# Patient Record
Sex: Male | Born: 1975 | Race: Black or African American | Hispanic: No | Marital: Married | Smoking: Never smoker
Health system: Southern US, Community
[De-identification: ages and names within clinical notes are randomized; demographics above are authoritative.]

## PROBLEM LIST (undated history)

## (undated) HISTORY — PX: URETHRAL DILATION: SUR417

## (undated) HISTORY — PX: VASECTOMY: SHX75

---

## 2007-04-07 ENCOUNTER — Ambulatory Visit: Payer: Self-pay | Admitting: Internal Medicine

## 2007-04-07 DIAGNOSIS — B351 Tinea unguium: Secondary | ICD-10-CM

## 2008-04-30 ENCOUNTER — Emergency Department (HOSPITAL_COMMUNITY): Admission: EM | Admit: 2008-04-30 | Discharge: 2008-04-30 | Payer: Self-pay | Admitting: Emergency Medicine

## 2009-10-12 IMAGING — CT CT ABDOMEN W/O CM
2 of 4 series · 17 of 46 positions shown, 19 images · non-contrast
Comparison: None available

CT ABDOMEN

CLINICAL DATA: Right flank pain

CT OF THE ABDOMEN AND PELVIS WITHOUT CONTRAST (CT UROGRAM)
TECHNIQUE: Multidetector CT imaging was performed through the
abdomen and pelvis to include the urinary tract.

[Series 2: stone_wo 5.0 b40f st · axial · 0.62mm/px · z∈[+224,+568]mm · 14 of 94 slices shown, 16 images]
[im 4/94  soft-tissue]
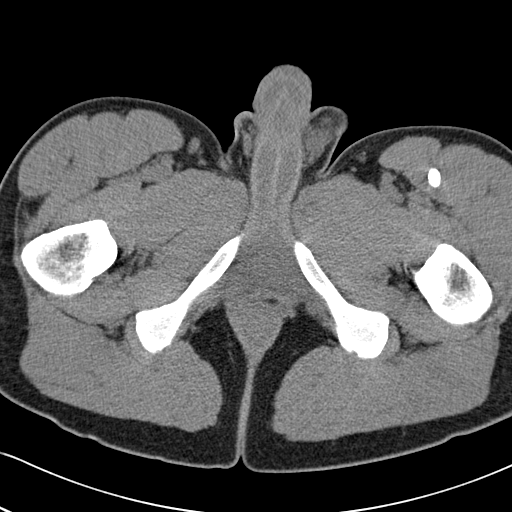
[im 4/94  bone]
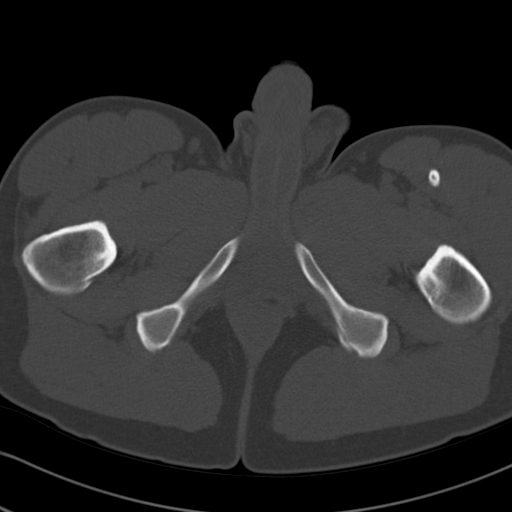
[im 11/94  soft-tissue]
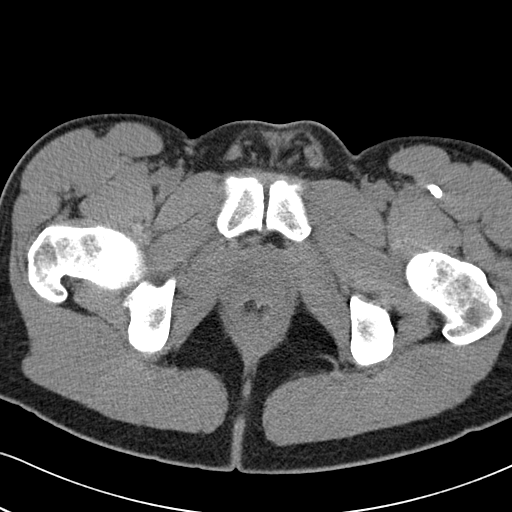
[im 18/94  soft-tissue]
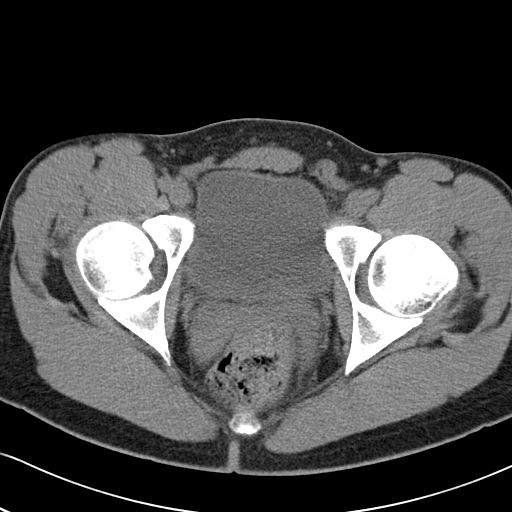
[im 26/94  soft-tissue]
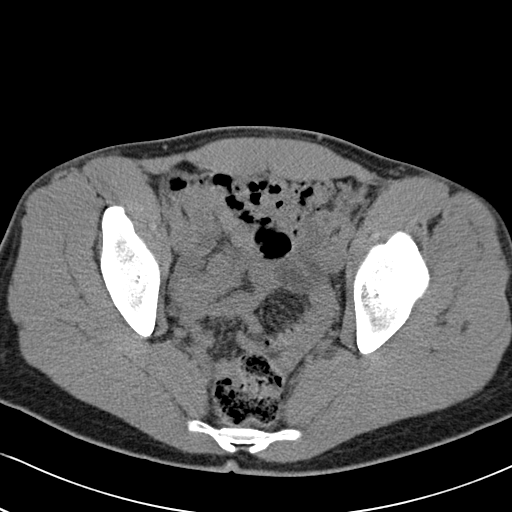
[im 33/94  soft-tissue]
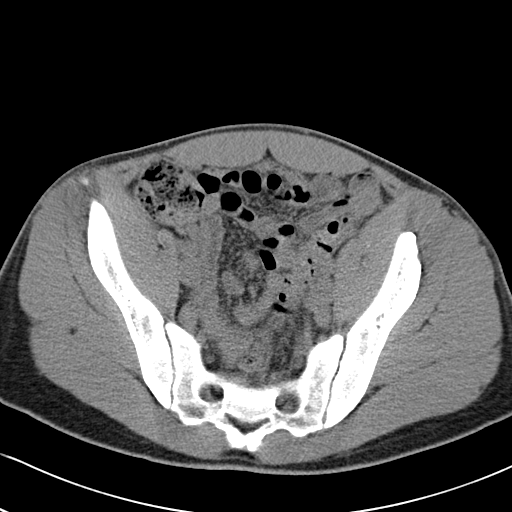
[im 36/94  soft-tissue]
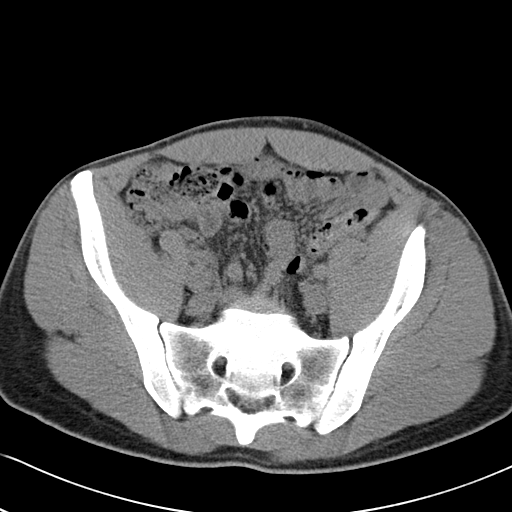
[im 43/94  soft-tissue]
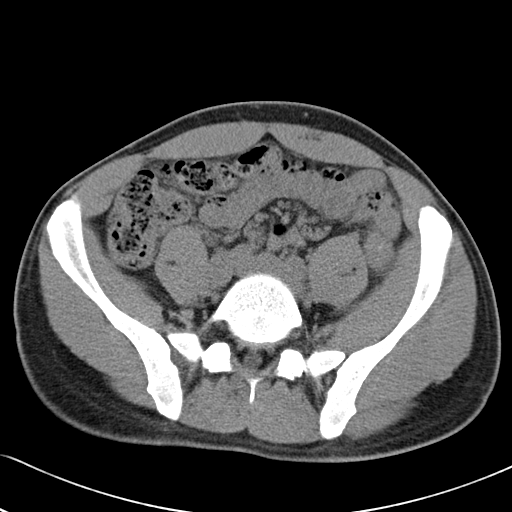
[im 51/94  soft-tissue]
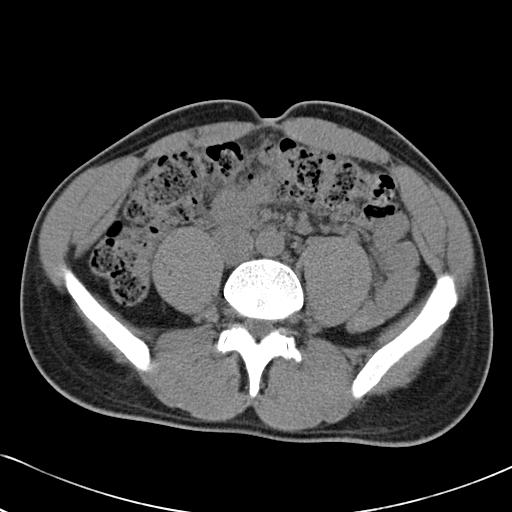
[im 58/94  soft-tissue]
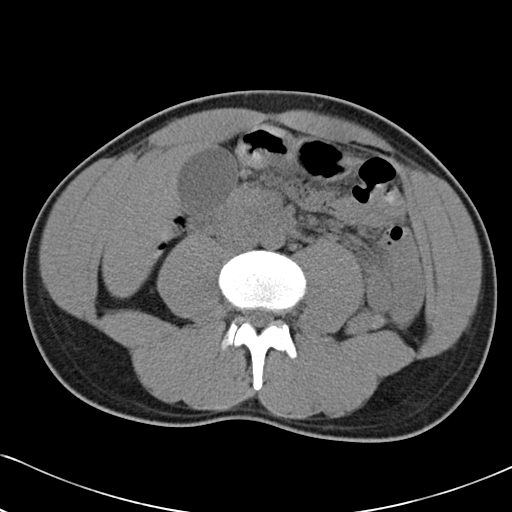
[im 58/94  bone]
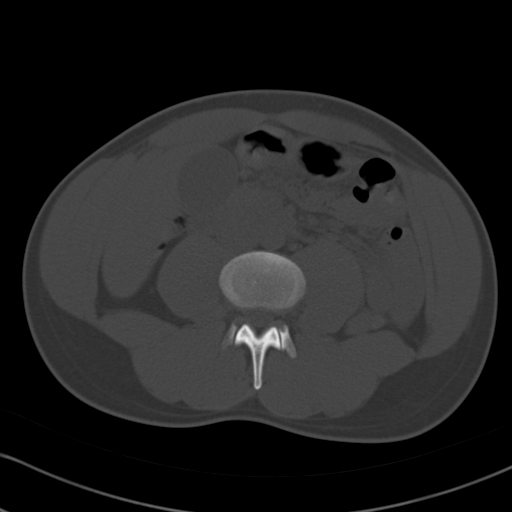
[im 61/94  soft-tissue]
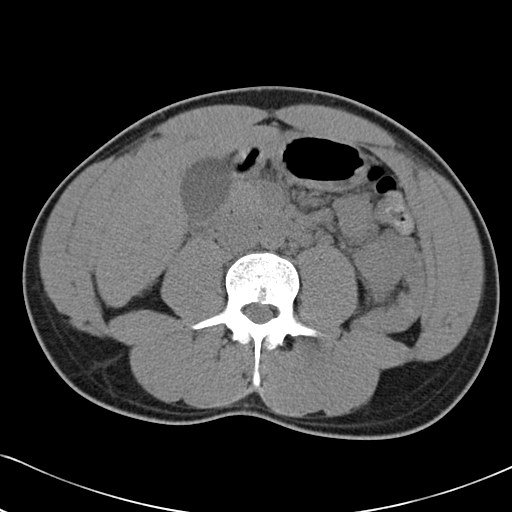
[im 68/94  soft-tissue]
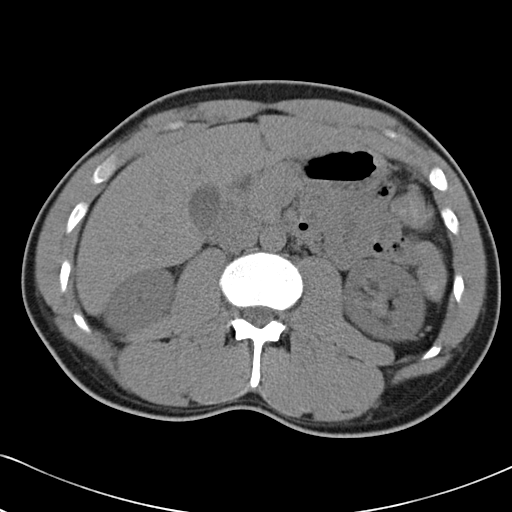
[im 76/94  soft-tissue]
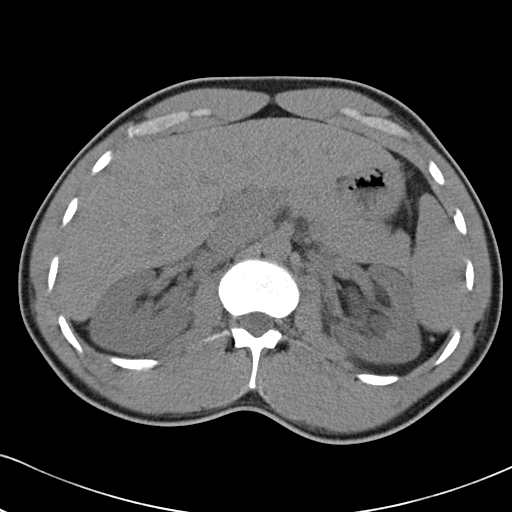
[im 83/94  soft-tissue]
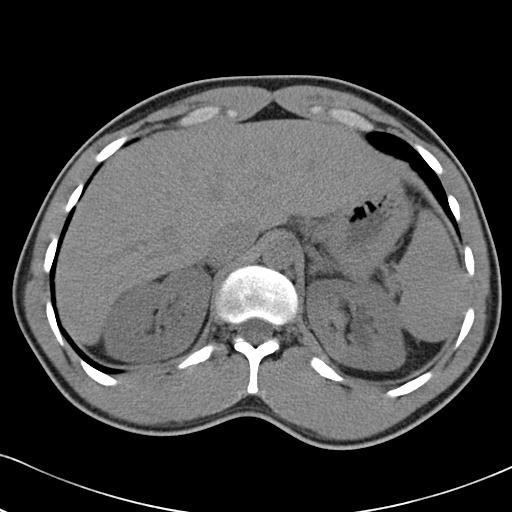
[im 90/94  soft-tissue]
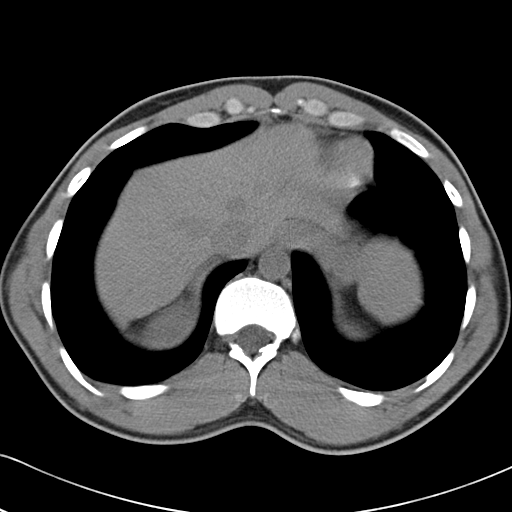

[Series 602: coronal · coronal · 0.77mm/px · 3 of 62 slices shown]
[im 21/62  soft-tissue]
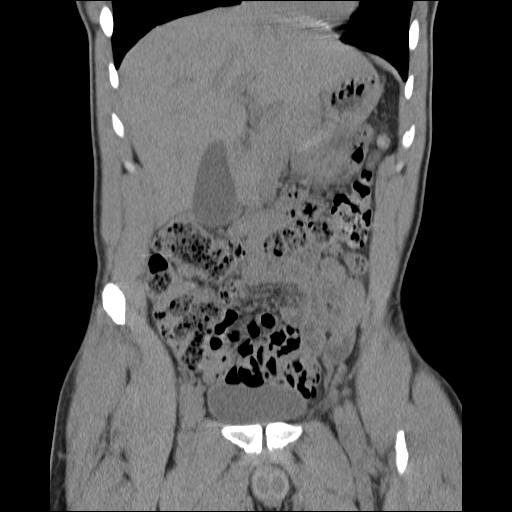
[im 28/62  soft-tissue]
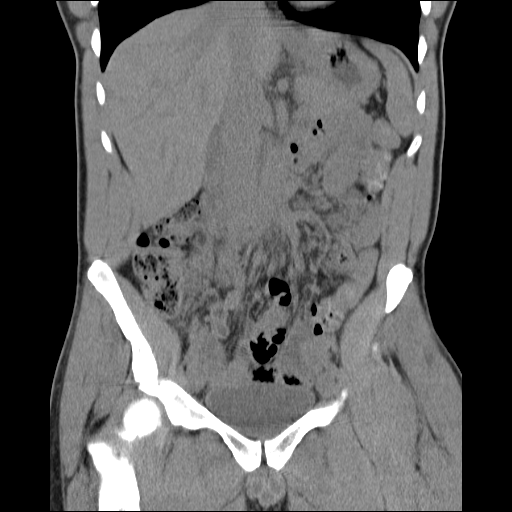
[im 34/62  soft-tissue]
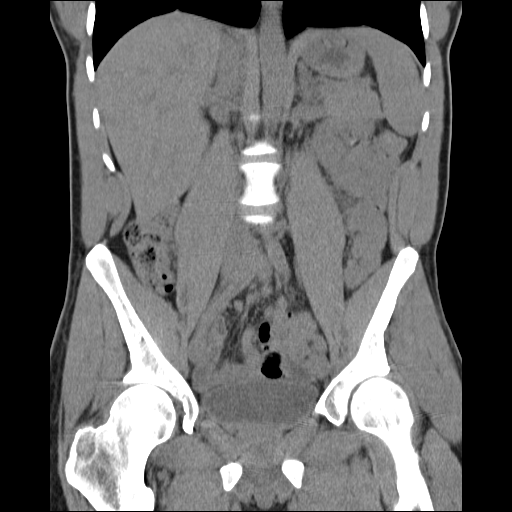

[17 of 46 positions shown; findings below may reference images not displayed]

FINDINGS: Lung bases are clear.  Liver, spleen, pancreas, adrenal
glands, and gallbladder all within normal limits.

Right kidney is normal without stones or hydronephrosis.  Left
kidney is hydronephrotic.  No stones are seen.  No retroperitoneal
abnormalities are detected and the visualized bowel loops are
unremarkable.
IMPRESSION: Left hydronephrosis. The relationship of this finding to the
patient's right flank pain is unclear.

CT PELVIS
FINDINGS: 1 - 2 mm ureteral calculus is lodged at the
ureterovesicle junction on the left.  Proximal to this there is
mild left hydroureter.  The appendix is visualized and is within
normal limits.  No pelvic masses are seen.  There is no free fluid
or free air.  Osseous structures are unremarkable.  No bladder
abnormalities are seen.
IMPRESSION: 1 - 2 mm left UVJ calculus, with proximal obstruction.

## 2011-04-09 LAB — URINALYSIS, ROUTINE W REFLEX MICROSCOPIC
Glucose, UA: NEGATIVE
Ketones, ur: NEGATIVE
Protein, ur: 30 — AB
Specific Gravity, Urine: 1.017

## 2011-04-09 LAB — DIFFERENTIAL
Basophils Absolute: 0.1
Basophils Relative: 1
Lymphocytes Relative: 6 — ABNORMAL LOW
Lymphs Abs: 0.7
Monocytes Absolute: 0.3
Monocytes Relative: 3
Neutrophils Relative %: 90 — ABNORMAL HIGH

## 2011-04-09 LAB — POCT I-STAT, CHEM 8
Creatinine, Ser: 1.1
HCT: 45
Potassium: 3.9

## 2011-04-09 LAB — CBC
Hemoglobin: 14.4
MCHC: 33.6
Platelets: 263

## 2011-04-09 LAB — URINE MICROSCOPIC-ADD ON

## 2019-10-04 ENCOUNTER — Ambulatory Visit: Payer: Self-pay | Attending: Internal Medicine

## 2019-10-04 DIAGNOSIS — Z23 Encounter for immunization: Secondary | ICD-10-CM

## 2019-10-04 NOTE — Progress Notes (Signed)
   Covid-19 Vaccination Clinic  Name:  Spyros Winch    MRN: [id number] DOB: 03/09/1976  10/04/2019  Mr. Benassi was observed post Covid-19 immunization for 15 minutes without incident. He was provided with Vaccine Information Sheet and instruction to access the V-Safe system.   Mr. Stthomas was instructed to call 911 with any severe reactions post vaccine: Marland Kitchen Difficulty breathing  . Swelling of face and throat  . A fast heartbeat  . A bad rash all over body  . Dizziness and weakness   Immunizations Administered    Name Date Dose VIS Date Route   JANSSEN COVID-19 VACCINE 10/04/2019  1:51 PM 0.5 mL 09/04/2019 Intramuscular   Manufacturer: [employer]   Lot: [id number]   NDC: [phone]

## 2020-05-16 ENCOUNTER — Telehealth: Payer: Self-pay

## 2020-05-16 NOTE — Telephone Encounter (Signed)
Pt walked in; pt had moved and saw Dr Lupe Carney at [facility] in [place] in 2016. Pt said the doctor told him he was a young man and did not need to come into office every year. Pt said he took what doctor said to heart and has not been seen by provider since 2016. Pt is on no medication. Pt said he has moved back to this area and his wife comes to [facility] so pt just dropped in. Pt has not seen Dr Verner Chol since 2008. Pt does want to re establish with Dr Alphonsus Sias because he prefers a male doctor.Pt said he feels fine but recently on and off his BP has been slightly elevated and pt said he thinks could be stressed related. NO SI/HI. Pt works for [employer] and at [employer] well visit at work 03/2020 pts BP was 126/82 P ?. But lately pt has been occasionally cking BP with regular sized BP cuff at home and it is fluctuating from 129 systolic to 140 -258 systolic and that prompted pt to come to [facility] for appt. This AM at home BP 154/84 with regular cuff. Pt works out at gym and has muscle. I took pts vitals T 35.7 C P 78 and pulse ox 96% BP 126/78 lge cuff sitting lt arm. Pt requested I take BP with reg cuff and BP 140/84. Pt said no  CP, SOB, H/A, dizziness, vision changes or no weakness in arms or legs. Pt said rt arm at bend does hurt upon movement but after exercising with pulling bands at gym pt has pain if moves arm at the bend. There is no pain in rt arm if does not move elbow. Pt can grasp a cup normally. Pt said he is not exercising right now due to rt arm hurting while exercising.  Pt said he feels reassured with the BP reading at our office and pt scheduled re establish appt with Dr Alphonsus Sias 06/13/20 and pt understands UC & ED precautions. FYI to Dr Alphonsus Sias.

## 2020-05-17 NOTE — Telephone Encounter (Signed)
Okay Seems fine to wait till December for him to re-establish

## 2020-06-13 ENCOUNTER — Other Ambulatory Visit: Payer: Self-pay

## 2020-06-13 ENCOUNTER — Ambulatory Visit: Payer: Managed Care, Other (non HMO) | Admitting: Internal Medicine

## 2020-06-13 ENCOUNTER — Encounter: Payer: Self-pay | Admitting: Internal Medicine

## 2020-06-13 VITALS — BP 122/82 | HR 65 | Temp 98.4°F | Ht 71.0 in | Wt 202.0 lb

## 2020-06-13 DIAGNOSIS — N528 Other male erectile dysfunction: Secondary | ICD-10-CM | POA: Diagnosis not present

## 2020-06-13 DIAGNOSIS — E7800 Pure hypercholesterolemia, unspecified: Secondary | ICD-10-CM | POA: Insufficient documentation

## 2020-06-13 DIAGNOSIS — Z0000 Encounter for general adult medical examination without abnormal findings: Secondary | ICD-10-CM | POA: Diagnosis not present

## 2020-06-13 DIAGNOSIS — N529 Male erectile dysfunction, unspecified: Secondary | ICD-10-CM | POA: Insufficient documentation

## 2020-06-13 LAB — COMPREHENSIVE METABOLIC PANEL
ALT: 16 U/L (ref 0–53)
AST: 22 U/L (ref 0–37)
Albumin: 4.2 g/dL (ref 3.5–5.2)
Alkaline Phosphatase: 64 U/L (ref 39–117)
BUN: 7 mg/dL (ref 6–23)
CO2: 33 mEq/L — ABNORMAL HIGH (ref 19–32)
Calcium: 9.6 mg/dL (ref 8.4–10.5)
Chloride: 104 mEq/L (ref 96–112)
Creatinine, Ser: 1.09 mg/dL (ref 0.40–1.50)
GFR: 83.04 mL/min (ref >60.00)
Glucose, Bld: 87 mg/dL (ref 70–99)
Potassium: 3.9 mEq/L (ref 3.5–5.1)
Sodium: 141 mEq/L (ref 135–145)
Total Bilirubin: 0.6 mg/dL (ref 0.2–1.2)
Total Protein: 7.5 g/dL (ref 6.0–8.3)

## 2020-06-13 LAB — CBC
HCT: 42.3 % (ref 39.0–52.0)
Hemoglobin: 14.2 g/dL (ref 13.0–17.0)
MCHC: 33.5 g/dL (ref 30.0–36.0)
MCV: 89.7 fl (ref 78.0–100.0)
Platelets: 258 10*3/uL (ref 150.0–400.0)
RBC: 4.72 Mil/uL (ref 4.22–5.81)
RDW: 13.7 % (ref 11.5–15.5)
WBC: 5.4 10*3/uL (ref 4.0–10.5)

## 2020-06-13 LAB — LIPID PANEL
Cholesterol: 203 mg/dL — ABNORMAL HIGH (ref 0–200)
HDL: 59 mg/dL (ref >39.00)
LDL Cholesterol: 126 mg/dL — ABNORMAL HIGH (ref 0–99)
NonHDL: 143.98
Total CHOL/HDL Ratio: 3
Triglycerides: 92 mg/dL (ref 0.0–149.0)
VLDL: 18.4 mg/dL (ref 0.0–40.0)

## 2020-06-13 MED ORDER — TADALAFIL 5 MG PO TABS: 5.0000 mg | ORAL_TABLET | Freq: Every day | ORAL | 11 refills | Status: DC

## 2020-06-13 NOTE — Assessment & Plan Note (Signed)
Frequent sex Discussed adequate sleep, stress mitigation Will try daily cialis

## 2020-06-13 NOTE — Assessment & Plan Note (Signed)
May have had mildly elevated cholesterol in the past Will check labs

## 2020-06-13 NOTE — Progress Notes (Signed)
Subjective:    Patient ID: Zachary Armstrong, male    DOB: 03/14/1976, 44 y.o.   MRN: [id number]  HPI Here to reestablish care This visit occurred during the SARS-CoV-2 public health emergency.  Safety protocols were in place, including screening questions prior to the visit, additional usage of staff PPE, and extensive cleaning of exam room while observing appropriate contact time as indicated for disinfecting solutions.   I had seen him back in 2008 Then he moved and traveled a lot Now living in [facility]   Has knot on back--wants to get it checked No pain Has gotten some bigger  Concerned about prostate cancer Runs in family ---uncle had bad prostate problems (obstruction) and GF had cancer  Has had some ED over the past 2 years Tried cialis from friend--worked great  No current outpatient medications on file prior to visit.   No current facility-administered medications on file prior to visit.    No Known Allergies  History reviewed. No pertinent past medical history.  Past Surgical History:  Procedure Laterality Date  . URETHRAL DILATION     ~ age 65  . VASECTOMY      Family History  Problem Relation Age of Onset  . Hypertension Mother   . Hypertension Father   . Diabetes Maternal Aunt   . Heart disease Paternal Grandmother   . Prostate cancer Paternal Grandfather     Social History   Socioeconomic History  . Marital status: Married    Spouse name: Not on file  . Number of children: 4  . Years of education: Not on file  . Highest education level: Not on file  Occupational History  . Occupation: Oncologist: [employer]  Tobacco Use  . Smoking status: Never Smoker  . Smokeless tobacco: Never Used  Substance and Sexual Activity  . Alcohol use: Yes    Comment: some wine socially  . Drug use: Not on file  . Sexual activity: Not on file  Other Topics Concern  . Not on file  Social History Narrative   3 sons and 1  daughter   Social Determinants of Health   Financial Resource Strain:   . Difficulty of Paying Living Expenses: Not on file  Food Insecurity:   . Worried About Programme researcher, broadcasting/film/video in the Last Year: Not on file  . Ran Out of Food in the Last Year: Not on file  Transportation Needs:   . Lack of Transportation (Medical): Not on file  . Lack of Transportation (Non-Medical): Not on file  Physical Activity:   . Days of Exercise per Week: Not on file  . Minutes of Exercise per Session: Not on file  Stress:   . Feeling of Stress : Not on file  Social Connections:   . Frequency of Communication with Friends and Family: Not on file  . Frequency of Social Gatherings with Friends and Family: Not on file  . Attends Religious Services: Not on file  . Active Member of Clubs or Organizations: Not on file  . Attends Banker Meetings: Not on file  . Marital Status: Not on file  Intimate Partner Violence:   . Fear of Current or Ex-Partner: Not on file  . Emotionally Abused: Not on file  . Physically Abused: Not on file  . Sexually Abused: Not on file   Review of Systems  Constitutional: Negative for fatigue.       Has 6-8# at waist he  needs to lose Wears seat belt  HENT: Negative for dental problem, hearing loss and tinnitus.   Eyes: Negative for visual disturbance.       No diplopia or unilateral vision loss  Respiratory: Negative for cough, chest tightness and shortness of breath.   Cardiovascular: Negative for chest pain and palpitations.       Brief ankle swelling---?related to specific shoes  Gastrointestinal: Negative for blood in stool and constipation.       No heartburn  Endocrine: Negative for polydipsia and polyuria.  Genitourinary: Negative for difficulty urinating and urgency.  Musculoskeletal: Negative for arthralgias and joint swelling.       Gets periodic sciatica every few months  Skin: Negative for rash.       Some fungal nails---recurrent   Allergic/Immunologic: Positive for environmental allergies. Negative for immunocompromised state.       Rarely uses claritin  Neurological: Negative for dizziness, syncope and light-headedness.       Occ sinus headaches  Hematological: Negative for adenopathy. Does not bruise/bleed easily.  Psychiatric/Behavioral: Negative for dysphoric mood and sleep disturbance. The patient is not nervous/anxious.        Objective:   Physical Exam Constitutional:      Appearance: Normal appearance.  HENT:     Right Ear: Tympanic membrane, ear canal and external ear normal.     Left Ear: Tympanic membrane, ear canal and external ear normal.     Mouth/Throat:     Pharynx: No oropharyngeal exudate or posterior oropharyngeal erythema.  Eyes:     Conjunctiva/sclera: Conjunctivae normal.     Pupils: Pupils are equal, round, and reactive to light.  Cardiovascular:     Rate and Rhythm: Normal rate and regular rhythm.     Pulses: Normal pulses.     Heart sounds: No murmur heard.  No gallop.   Pulmonary:     Effort: Pulmonary effort is normal.     Breath sounds: Normal breath sounds. No wheezing or rales.  Abdominal:     Palpations: Abdomen is soft.     Tenderness: There is no abdominal tenderness.  Musculoskeletal:     Cervical back: Neck supple.     Right lower leg: No edema.     Left lower leg: No edema.  Lymphadenopathy:     Cervical: No cervical adenopathy.  Skin:    Findings: No rash.     Comments: ~4cm movable mass in mid left back----probable lipoma  Neurological:     General: No focal deficit present.     Mental Status: He is alert and oriented to person, place, and time.  Psychiatric:        Mood and Affect: Mood normal.        Behavior: Behavior normal.            Assessment & Plan:

## 2020-06-13 NOTE — Assessment & Plan Note (Signed)
Healthy May have had Td a few years ago----will update if not Stays fit Consider PSA at 32 ---colon screening then as well Has had COVID vaccine Plans flu vaccine soon

## 2020-12-21 ENCOUNTER — Telehealth: Payer: Self-pay

## 2020-12-21 MED ORDER — TADALAFIL 5 MG PO TABS: 5.0000 mg | ORAL_TABLET | Freq: Every day | ORAL | 1 refills | Status: DC

## 2020-12-21 NOTE — Telephone Encounter (Signed)
Rx sent electronically.  

## 2021-11-07 ENCOUNTER — Other Ambulatory Visit: Payer: Self-pay | Admitting: Internal Medicine

## 2021-11-08 MED ORDER — TADALAFIL 5 MG PO TABS: 5.0000 mg | ORAL_TABLET | Freq: Every day | ORAL | 1 refills | Status: DC

## 2021-11-08 NOTE — Telephone Encounter (Signed)
Spoke to pt to fine out if he had been taking the medication as this request says last filled June 2022. He said he had been getting it through mail order. Actually, he wants this to go to [facility]. I also discussed that I was not sure if he needed to f/u now or end of the year as stated in his last OV Note. He said he wanted to make a CPE appt now. So I scheduled him for 11-15-21. ?

## 2021-11-15 ENCOUNTER — Ambulatory Visit (INDEPENDENT_AMBULATORY_CARE_PROVIDER_SITE_OTHER): Payer: Managed Care, Other (non HMO) | Admitting: Internal Medicine

## 2021-11-15 ENCOUNTER — Encounter: Payer: Self-pay | Admitting: Internal Medicine

## 2021-11-15 VITALS — BP 118/74 | HR 84 | Temp 97.9°F | Ht 71.0 in | Wt 205.0 lb

## 2021-11-15 DIAGNOSIS — Z0000 Encounter for general adult medical examination without abnormal findings: Secondary | ICD-10-CM | POA: Diagnosis not present

## 2021-11-15 DIAGNOSIS — Z125 Encounter for screening for malignant neoplasm of prostate: Secondary | ICD-10-CM

## 2021-11-15 DIAGNOSIS — Z1211 Encounter for screening for malignant neoplasm of colon: Secondary | ICD-10-CM

## 2021-11-15 DIAGNOSIS — Z23 Encounter for immunization: Secondary | ICD-10-CM | POA: Diagnosis not present

## 2021-11-15 DIAGNOSIS — E7800 Pure hypercholesterolemia, unspecified: Secondary | ICD-10-CM | POA: Diagnosis not present

## 2021-11-15 MED ORDER — TADALAFIL 5 MG PO TABS: 5.0000 mg | ORAL_TABLET | Freq: Every day | ORAL | 3 refills | Status: DC

## 2021-11-15 NOTE — Assessment & Plan Note (Signed)
Borderline elevated ?HDL pretty good ?Will just recheck ?

## 2021-11-15 NOTE — Assessment & Plan Note (Signed)
Healthy ?Works out regularly ?Will set up colon ?He requests PSA---strong FH ?COVID/flu vaccines in fall ?Update Td ?

## 2021-11-15 NOTE — Progress Notes (Signed)
? ?Subjective:  ? ? Patient ID: Zachary Armstrong, male    DOB: 05-Sep-1975, 46 y.o.   MRN: [id number] ? ?HPI ?Here for physical ? ?Has noticed a big difference with the tadalfil---usually takes every other day ? ?Now has "form of eczema"--mostly on back of legs ?Scratches a lot ?Some on thighs ?[place] soap ? ?Concerned about thick toenail--?fungus ?Just on right big toe ? ?Current Outpatient Medications on File Prior to Visit  ?Medication Sig Dispense Refill  ? tadalafil (CIALIS) 5 MG tablet Take 1 tablet (5 mg total) by mouth daily. 90 tablet 1  ? ?No current facility-administered medications on file prior to visit.  ? ? ?No Known Allergies ? ?History reviewed. No pertinent past medical history. ? ?Past Surgical History:  ?Procedure Laterality Date  ? URETHRAL DILATION    ? ~ age 19  ? VASECTOMY    ? ? ?Family History  ?Problem Relation Age of Onset  ? Hypertension Mother   ? Hypertension Father   ? Diabetes Maternal Aunt   ? Heart disease Paternal Grandmother   ? Prostate cancer Paternal Grandfather   ? ? ?Social History  ? ?Socioeconomic History  ? Marital status: Married  ?  Spouse name: Not on file  ? Number of children: 4  ? Years of education: Not on file  ? Highest education level: Not on file  ?Occupational History  ? Occupation: Chartered certified accountant  ?  Employer: [employer]  ?Tobacco Use  ? Smoking status: Never  ?  Passive exposure: Past  ? Smokeless tobacco: Never  ?Substance and Sexual Activity  ? Alcohol use: Yes  ?  Comment: some wine socially  ? Drug use: Not on file  ? Sexual activity: Not on file  ?Other Topics Concern  ? Not on file  ?Social History Narrative  ? 3 sons and 1 daughter  ? ?Social Determinants of Health  ? ?Financial Resource Strain: Not on file  ?Food Insecurity: Not on file  ?Transportation Needs: Not on file  ?Physical Activity: Not on file  ?Stress: Not on file  ?Social Connections: Not on file  ?Intimate Partner Violence: Not on file  ? ?Review of Systems   ?Constitutional:  Negative for fatigue and unexpected weight change.  ?     Wears seat belt  ?HENT:  Negative for dental problem, hearing loss and tinnitus.   ?Eyes:  Negative for visual disturbance.  ?     No diplopia or unilateral vision loss  ?Respiratory:  Negative for cough, chest tightness and shortness of breath.   ?Cardiovascular:  Negative for chest pain, palpitations and leg swelling.  ?Gastrointestinal:  Negative for blood in stool and constipation.  ?     Occ indigestion --like with some ice cream  ?Endocrine: Negative for polydipsia and polyuria.  ?Genitourinary:  Negative for urgency.  ?     Some slow stream at first if holds it  ?Musculoskeletal:  Negative for arthralgias, back pain and joint swelling.  ?Skin:  Positive for rash.  ?Allergic/Immunologic: Positive for environmental allergies. Negative for immunocompromised state.  ?     Rare symptoms--no meds  ?Neurological:  Negative for dizziness, syncope and light-headedness.  ?     Rare sinus headaches  ?Hematological:  Negative for adenopathy. Does not bruise/bleed easily.  ?Psychiatric/Behavioral:  Negative for dysphoric mood and sleep disturbance. The patient is not nervous/anxious.   ?     Some stress---marital issues  ? ?   ?Objective:  ? Physical Exam ?Constitutional:   ?  Appearance: Normal appearance.  ?HENT:  ?   Mouth/Throat:  ?   Pharynx: No oropharyngeal exudate or posterior oropharyngeal erythema.  ?Eyes:  ?   Conjunctiva/sclera: Conjunctivae normal.  ?   Pupils: Pupils are equal, round, and reactive to light.  ?Cardiovascular:  ?   Rate and Rhythm: Normal rate and regular rhythm.  ?   Pulses: Normal pulses.  ?   Heart sounds: No murmur heard. ?  No gallop.  ?Abdominal:  ?   Palpations: Abdomen is soft.  ?   Tenderness: There is no abdominal tenderness.  ?Musculoskeletal:  ?   Cervical back: Neck supple.  ?   Right lower leg: No edema.  ?   Left lower leg: No edema.  ?Lymphadenopathy:  ?   Cervical: No cervical adenopathy.  ?Skin: ?    Comments: Mycotic right great toenail ?Slight changes in 2nd nail  ?Neurological:  ?   General: No focal deficit present.  ?   Mental Status: He is alert and oriented to person, place, and time.  ?Psychiatric:     ?   Mood and Affect: Mood normal.     ?   Behavior: Behavior normal.  ?  ? ? ? ? ?   ?Assessment & Plan:  ? ?

## 2021-11-16 LAB — COMPREHENSIVE METABOLIC PANEL
ALT: 16 IU/L (ref 0–44)
AST: 22 IU/L (ref 0–40)
Albumin/Globulin Ratio: 1.4 (ref 1.2–2.2)
Albumin: 4.2 g/dL (ref 4.0–5.0)
Alkaline Phosphatase: 72 IU/L (ref 44–121)
BUN/Creatinine Ratio: 6 — ABNORMAL LOW (ref 9–20)
BUN: 7 mg/dL (ref 6–24)
Bilirubin Total: 0.4 mg/dL (ref 0.0–1.2)
CO2: 24 mmol/L (ref 20–29)
Calcium: 9.2 mg/dL (ref 8.7–10.2)
Chloride: 103 mmol/L (ref 96–106)
Creatinine, Ser: 1.23 mg/dL (ref 0.76–1.27)
Globulin, Total: 3 g/dL (ref 1.5–4.5)
Glucose: 88 mg/dL (ref 70–99)
Potassium: 3.6 mmol/L (ref 3.5–5.2)
Sodium: 142 mmol/L (ref 134–144)
Total Protein: 7.2 g/dL (ref 6.0–8.5)
eGFR: 74 mL/min/{1.73_m2} (ref >59)

## 2021-11-16 LAB — CBC
Hematocrit: 40.1 % (ref 37.5–51.0)
Hemoglobin: 13.6 g/dL (ref 13.0–17.7)
MCH: 30.1 pg (ref 26.6–33.0)
MCHC: 33.9 g/dL (ref 31.5–35.7)
MCV: 89 fL (ref 79–97)
Platelets: 273 10*3/uL (ref 150–450)
RBC: 4.52 x10E6/uL (ref 4.14–5.80)
RDW: 13.1 % (ref 11.6–15.4)
WBC: 4.7 10*3/uL (ref 3.4–10.8)

## 2021-11-16 LAB — LIPID PANEL
Chol/HDL Ratio: 3.4 ratio (ref 0.0–5.0)
Cholesterol, Total: 213 mg/dL — ABNORMAL HIGH (ref 100–199)
HDL: 62 mg/dL (ref >39)
LDL Chol Calc (NIH): 135 mg/dL — ABNORMAL HIGH (ref 0–99)
Triglycerides: 93 mg/dL (ref 0–149)
VLDL Cholesterol Cal: 16 mg/dL (ref 5–40)

## 2021-11-16 LAB — PSA: Prostate Specific Ag, Serum: 2.1 ng/mL (ref 0.0–4.0)

## 2022-02-16 ENCOUNTER — Encounter: Payer: Self-pay | Admitting: Internal Medicine

## 2022-03-06 ENCOUNTER — Other Ambulatory Visit: Payer: Self-pay | Admitting: Internal Medicine

## 2022-03-12 MED ORDER — TADALAFIL 5 MG PO TABS: 5.0000 mg | ORAL_TABLET | Freq: Every day | ORAL | 3 refills | Status: DC

## 2022-05-29 ENCOUNTER — Encounter: Payer: Self-pay | Admitting: Internal Medicine

## 2022-08-22 ENCOUNTER — Encounter: Payer: Self-pay | Admitting: Internal Medicine

## 2022-08-22 ENCOUNTER — Ambulatory Visit: Payer: Managed Care, Other (non HMO) | Admitting: Internal Medicine

## 2022-08-22 VITALS — BP 126/60 | HR 96 | Temp 98.0°F | Ht 71.0 in | Wt 202.0 lb

## 2022-08-22 DIAGNOSIS — R030 Elevated blood-pressure reading, without diagnosis of hypertension: Secondary | ICD-10-CM

## 2022-08-22 DIAGNOSIS — Z1211 Encounter for screening for malignant neoplasm of colon: Secondary | ICD-10-CM

## 2022-08-22 NOTE — Addendum Note (Signed)
Addended by: Viviana Simpler I on: 08/22/2022 03:45 PM   Modules accepted: Orders

## 2022-08-22 NOTE — Progress Notes (Signed)
   Subjective:    Patient ID: Zachary Armstrong, male    DOB: [birth year], 47 y.o.   MRN: [id number]  HPI Here due to concerns about his blood pressure being elevated  Has had health care screening at work Did his work out right before going---and his BP was 144/72 He does check at home--but doesn't trust his machine  He just wanted to be sure his BP was okay No chest pain or SOB No change in exercise tolerance  Still home stress In same house--but sort of separated Children 10, 14, 16, 18  Current Outpatient Medications on File Prior to Visit  Medication Sig Dispense Refill   tadalafil (CIALIS) 5 MG tablet Take 1 tablet (5 mg total) by mouth daily. 90 tablet 3   No current facility-administered medications on file prior to visit.    No Known Allergies  History reviewed. No pertinent past medical history.  Past Surgical History:  Procedure Laterality Date   URETHRAL DILATION     ~ age 3   VASECTOMY      Family History  Problem Relation Age of Onset   Hypertension Mother    Hypertension Father    Diabetes Maternal Aunt    Heart disease Paternal Grandmother    Prostate cancer Paternal Grandfather     Social History   Socioeconomic History   Marital status: Married    Spouse name: Not on file   Number of children: 4   Years of education: Not on file   Highest education level: Not on file  Occupational History   Occupation: Occupational psychologist: [employer]  Tobacco Use   Smoking status: Never    Passive exposure: Past   Smokeless tobacco: Never  Substance and Sexual Activity   Alcohol use: Yes    Comment: some wine socially   Drug use: Not on file   Sexual activity: Not on file  Other Topics Concern   Not on file  Social History Narrative   3 sons and 1 daughter   Social Determinants of Health   Financial Resource Strain: Not on file  Food Insecurity: Not on file  Transportation Needs: Not on file  Physical Activity: Not on file   Stress: Not on file  Social Connections: Not on file  Intimate Partner Violence: Not on file   Review of Systems He is interested in getting first colonoscopy    Objective:   Physical Exam Constitutional:      Appearance: Normal appearance.  Cardiovascular:     Rate and Rhythm: Normal rate and regular rhythm.     Heart sounds: No murmur heard.    No gallop.  Pulmonary:     Effort: Pulmonary effort is normal.     Breath sounds: Normal breath sounds. No wheezing or rales.  Musculoskeletal:     Cervical back: Neck supple.     Right lower leg: No edema.     Left lower leg: No edema.  Lymphadenopathy:     Cervical: No cervical adenopathy.  Neurological:     Mental Status: He is alert.            Assessment & Plan:

## 2022-08-22 NOTE — Assessment & Plan Note (Signed)
BP Readings from Last 3 Encounters:  08/22/22 126/60  11/15/21 118/74  06/13/20 122/82   Single high reading (borderline) at work screening Generally has been fine Reassured--no action needed

## 2022-10-08 ENCOUNTER — Encounter: Payer: Self-pay | Admitting: Internal Medicine

## 2022-12-19 ENCOUNTER — Encounter: Payer: Self-pay | Admitting: Internal Medicine

## 2022-12-19 ENCOUNTER — Ambulatory Visit (INDEPENDENT_AMBULATORY_CARE_PROVIDER_SITE_OTHER): Payer: Managed Care, Other (non HMO) | Admitting: Internal Medicine

## 2022-12-19 VITALS — BP 118/76 | HR 66 | Temp 98.0°F | Ht 71.0 in | Wt 202.0 lb

## 2022-12-19 DIAGNOSIS — Z0000 Encounter for general adult medical examination without abnormal findings: Secondary | ICD-10-CM

## 2022-12-19 DIAGNOSIS — Z1211 Encounter for screening for malignant neoplasm of colon: Secondary | ICD-10-CM

## 2022-12-19 DIAGNOSIS — Z125 Encounter for screening for malignant neoplasm of prostate: Secondary | ICD-10-CM

## 2022-12-19 DIAGNOSIS — E7800 Pure hypercholesterolemia, unspecified: Secondary | ICD-10-CM | POA: Diagnosis not present

## 2022-12-19 NOTE — Progress Notes (Signed)
Subjective:    Patient ID: Sohrab Vannelli, male    DOB: [birth year], 47 y.o.   MRN: [id number]  HPI Here for physical  Feels good Working out more now Tries to eat right  Still has the lump on his back No pain--somewhat mobile Just wants it checked  Current Outpatient Medications on File Prior to Visit  Medication Sig Dispense Refill   tadalafil (CIALIS) 5 MG tablet Take 1 tablet (5 mg total) by mouth daily. 90 tablet 3   No current facility-administered medications on file prior to visit.    No Known Allergies  History reviewed. No pertinent past medical history.  Past Surgical History:  Procedure Laterality Date   URETHRAL DILATION     ~ age 67   VASECTOMY      Family History  Problem Relation Age of Onset   Hypertension Mother    Hypertension Father    Diabetes Maternal Aunt    Heart disease Paternal Grandmother    Prostate cancer Paternal Grandfather     Social History   Socioeconomic History   Marital status: Married    Spouse name: Not on file   Number of children: 4   Years of education: Not on file   Highest education level: Not on file  Occupational History   Occupation: Chartered certified accountant    Employer: [employer]  Tobacco Use   Smoking status: Never    Passive exposure: Past   Smokeless tobacco: Never  Substance and Sexual Activity   Alcohol use: Yes    Comment: some wine socially   Drug use: Not on file   Sexual activity: Not on file  Other Topics Concern   Not on file  Social History Narrative   3 sons and 1 daughter   Social Determinants of Health   Financial Resource Strain: Not on file  Food Insecurity: Not on file  Transportation Needs: Not on file  Physical Activity: Not on file  Stress: Not on file  Social Connections: Not on file  Intimate Partner Violence: Not on file   Review of Systems  Constitutional:  Negative for fatigue and unexpected weight change.       Wears seat belt  HENT:  Negative for dental  problem, hearing loss and tinnitus.        Keeps up with dentist  Eyes:  Negative for visual disturbance.       No diplopia or unilateral vision loss  Respiratory:  Negative for cough, chest tightness and shortness of breath.   Cardiovascular:  Negative for chest pain, palpitations and leg swelling.  Gastrointestinal:  Negative for blood in stool and constipation.       No heartburn  Endocrine: Negative for polydipsia and polyuria.  Genitourinary:  Negative for difficulty urinating and urgency.       Uses the tadalafil sporadically  Musculoskeletal:  Negative for arthralgias and joint swelling.       Some sciatica  Skin:  Negative for rash.       No suspicious skin lesions  Allergic/Immunologic: Positive for environmental allergies. Negative for immunocompromised state.       Early spring---zyrtec helps  Neurological:  Negative for dizziness, syncope and light-headedness.       Rare headaches---relates to sinuses  Hematological:  Negative for adenopathy. Does not bruise/bleed easily.  Psychiatric/Behavioral:  Negative for dysphoric mood and sleep disturbance. The patient is not nervous/anxious.        Objective:   Physical Exam Constitutional:  Appearance: Normal appearance.  HENT:     Mouth/Throat:     Pharynx: No oropharyngeal exudate or posterior oropharyngeal erythema.  Eyes:     Conjunctiva/sclera: Conjunctivae normal.     Pupils: Pupils are equal, round, and reactive to light.  Cardiovascular:     Rate and Rhythm: Normal rate and regular rhythm.     Pulses: Normal pulses.     Heart sounds: No murmur heard.    No gallop.  Pulmonary:     Effort: Pulmonary effort is normal.     Breath sounds: Normal breath sounds. No wheezing or rales.  Abdominal:     Palpations: Abdomen is soft.     Tenderness: There is no abdominal tenderness.  Musculoskeletal:     Cervical back: Neck supple.     Right lower leg: No edema.     Left lower leg: No edema.  Lymphadenopathy:      Cervical: No cervical adenopathy.  Skin:    Findings: No lesion or rash.     Comments: ~2 x 3 cm apparent lipoma on left mid back  Neurological:     General: No focal deficit present.     Mental Status: He is alert and oriented to person, place, and time.  Psychiatric:        Mood and Affect: Mood normal.        Behavior: Behavior normal.            Assessment & Plan:

## 2022-12-19 NOTE — Assessment & Plan Note (Signed)
Healthy Exercises regularly Will try again to set up colonoscopy Check PSA --positive FH Recommended updated COVID and flu vaccines in fall

## 2022-12-19 NOTE — Assessment & Plan Note (Signed)
Mild Low risk Will recheck

## 2022-12-20 ENCOUNTER — Encounter: Payer: Self-pay | Admitting: Internal Medicine

## 2022-12-20 LAB — COMPREHENSIVE METABOLIC PANEL
ALT: 16 IU/L (ref 0–44)
AST: 22 IU/L (ref 0–40)
Albumin/Globulin Ratio: 1.5
Albumin: 4.3 g/dL (ref 4.1–5.1)
Alkaline Phosphatase: 80 IU/L (ref 44–121)
BUN/Creatinine Ratio: 7 — ABNORMAL LOW (ref 9–20)
BUN: 8 mg/dL (ref 6–24)
Bilirubin Total: 0.6 mg/dL (ref 0.0–1.2)
CO2: 26 mmol/L (ref 20–29)
Calcium: 9.7 mg/dL (ref 8.7–10.2)
Chloride: 102 mmol/L (ref 96–106)
Creatinine, Ser: 1.1 mg/dL (ref 0.76–1.27)
Globulin, Total: 2.8 g/dL (ref 1.5–4.5)
Glucose: 81 mg/dL (ref 70–99)
Potassium: 4.1 mmol/L (ref 3.5–5.2)
Sodium: 141 mmol/L (ref 134–144)
Total Protein: 7.1 g/dL (ref 6.0–8.5)
eGFR: 84 mL/min/{1.73_m2} (ref >59)

## 2022-12-20 LAB — LIPID PANEL
Chol/HDL Ratio: 3 ratio (ref 0.0–5.0)
Cholesterol, Total: 199 mg/dL (ref 100–199)
HDL: 67 mg/dL (ref >39)
LDL Chol Calc (NIH): 118 mg/dL — ABNORMAL HIGH (ref 0–99)
Triglycerides: 79 mg/dL (ref 0–149)
VLDL Cholesterol Cal: 14 mg/dL (ref 5–40)

## 2022-12-20 LAB — CBC
Hematocrit: 41.7 % (ref 37.5–51.0)
Hemoglobin: 13.7 g/dL (ref 13.0–17.7)
MCH: 29.5 pg (ref 26.6–33.0)
MCHC: 32.9 g/dL (ref 31.5–35.7)
MCV: 90 fL (ref 79–97)
Platelets: 202 10*3/uL (ref 150–450)
RBC: 4.64 x10E6/uL (ref 4.14–5.80)
RDW: 13.7 % (ref 11.6–15.4)
WBC: 4.6 10*3/uL (ref 3.4–10.8)

## 2022-12-20 LAB — PSA: Prostate Specific Ag, Serum: 2.4 ng/mL (ref 0.0–4.0)

## 2023-01-21 ENCOUNTER — Encounter: Payer: Self-pay | Admitting: Internal Medicine

## 2023-01-23 NOTE — Telephone Encounter (Signed)
Form filled out and placed on Dr Lupita Dawn desk in inbox.

## 2023-02-28 ENCOUNTER — Encounter: Payer: Self-pay | Admitting: Internal Medicine

## 2023-03-14 ENCOUNTER — Other Ambulatory Visit: Payer: Self-pay | Admitting: Internal Medicine

## 2023-03-17 MED ORDER — TADALAFIL 10 MG PO TABS: 10.0000 mg | ORAL_TABLET | Freq: Every day | ORAL | 5 refills | Status: DC | PRN

## 2023-05-22 ENCOUNTER — Encounter: Payer: Self-pay | Admitting: Internal Medicine

## 2023-05-22 ENCOUNTER — Ambulatory Visit (INDEPENDENT_AMBULATORY_CARE_PROVIDER_SITE_OTHER): Payer: Managed Care, Other (non HMO) | Admitting: Internal Medicine

## 2023-05-22 VITALS — BP 132/80 | HR 100 | Temp 98.3°F | Ht 71.0 in | Wt 211.0 lb

## 2023-05-22 DIAGNOSIS — R600 Localized edema: Secondary | ICD-10-CM | POA: Insufficient documentation

## 2023-05-22 NOTE — Assessment & Plan Note (Signed)
Reassured him that this is not worrisome--just gravity with very minor vein insufficiency Discussed avoiding salt Can consider support socks--but no pain No need to recheck labs

## 2023-05-22 NOTE — Progress Notes (Signed)
   Subjective:    Patient ID: Zachary Armstrong, male    DOB: 03/12/1976, 47 y.o.   MRN: [id number]  HPI Here due to leg swelling  Moved in August to apartment---3rd floor Not legally separated--but moved out Since then, if he falls asleep on couch---his feet are swollen (after 2-3 hours) Then they get back to normal when lying in bed That didn't happen when he lived in the house Does note indention in socks at the end of the day--that goes way back  No swelling during the day otherwise Is using the tadalafil---once or twice a week (20mg )  Has a new job---loves it--from home Will be travelling more  Current Outpatient Medications on File Prior to Visit  Medication Sig Dispense Refill   tadalafil (CIALIS) 10 MG tablet Take 1 tablet (10 mg total) by mouth daily as needed for erectile dysfunction. 30 tablet 5   No current facility-administered medications on file prior to visit.    No Known Allergies  History reviewed. No pertinent past medical history.  Past Surgical History:  Procedure Laterality Date   URETHRAL DILATION     ~ age 10   VASECTOMY      Family History  Problem Relation Age of Onset   Hypertension Mother    Hypertension Father    Diabetes Maternal Aunt    Heart disease Paternal Grandmother    Prostate cancer Paternal Grandfather     Social History   Socioeconomic History   Marital status: Married    Spouse name: Not on file   Number of children: 4   Years of education: Not on file   Highest education level: Not on file  Occupational History   Occupation: Veterinary surgeon and marketing    Comment: [employer]--works at home  Tobacco Use   Smoking status: Never    Passive exposure: Past   Smokeless tobacco: Never  Substance and Sexual Activity   Alcohol use: Yes    Comment: some wine socially   Drug use: Not on file   Sexual activity: Not on file  Other Topics Concern   Not on file  Social History Narrative   3 sons and 1  daughter   Social Determinants of Health   Financial Resource Strain: Not on file  Food Insecurity: Not on file  Transportation Needs: Not on file  Physical Activity: Not on file  Stress: Not on file  Social Connections: Not on file  Intimate Partner Violence: Not on file   Review of Systems No chest pain or SOB Still works out---no change in exercise tolerance    Objective:   Physical Exam Constitutional:      Appearance: Normal appearance.  Cardiovascular:     Rate and Rhythm: Normal rate and regular rhythm.     Heart sounds: No murmur heard.    No gallop.  Pulmonary:     Effort: Pulmonary effort is normal.     Breath sounds: Normal breath sounds. No wheezing or rales.  Musculoskeletal:     Cervical back: Neck supple.     Comments: Trace calf edema  Lymphadenopathy:     Cervical: No cervical adenopathy.  Neurological:     Mental Status: He is alert.  Psychiatric:        Mood and Affect: Mood normal.        Behavior: Behavior normal.            Assessment & Plan:

## 2023-06-06 ENCOUNTER — Encounter: Payer: Self-pay | Admitting: Internal Medicine

## 2023-06-13 MED ORDER — TADALAFIL 20 MG PO TABS: 10.0000 mg | ORAL_TABLET | ORAL | 11 refills | Status: DC | PRN

## 2023-07-07 NOTE — Telephone Encounter (Signed)
Called and spoke to pt. Advised his HR can be elevated due to being sick. The Theraflu has decongestant in it. He will keep an eye on it and let us know if it does not get any better. I advised him that if he starts to have other symptoms like a headache, numbness and tingling, jaw pain to go to the ER.   I will check on him in the morning.

## 2023-07-08 NOTE — Telephone Encounter (Signed)
 Called to speak to pt. He has a fever. He went to urgent care last night. They did not test him for covid. They told him the decongestant was raising his heart rate. It has come down now. He had not been taking a fever reducer. He has Tylenol Extra Strength he will start taking. I gave him information on how to do a virtual visit through his MyChart if he did not get better over the holiday.

## 2023-07-11 ENCOUNTER — Ambulatory Visit: Payer: Managed Care, Other (non HMO) | Admitting: Family Medicine

## 2023-07-11 ENCOUNTER — Encounter: Payer: Self-pay | Admitting: Family Medicine

## 2023-07-11 VITALS — BP 122/60 | HR 80 | Temp 99.0°F | Ht 71.0 in | Wt 204.7 lb

## 2023-07-11 DIAGNOSIS — R059 Cough, unspecified: Secondary | ICD-10-CM | POA: Diagnosis not present

## 2023-07-11 DIAGNOSIS — R509 Fever, unspecified: Secondary | ICD-10-CM

## 2023-07-11 LAB — POCT INFLUENZA A/B
Influenza A, POC: NEGATIVE
Influenza B, POC: NEGATIVE

## 2023-07-11 LAB — POC COVID19 BINAXNOW: SARS Coronavirus 2 Ag: NEGATIVE

## 2023-07-11 MED ORDER — IBUPROFEN 800 MG PO TABS: 800.0000 mg | ORAL_TABLET | Freq: Three times a day (TID) | ORAL | 0 refills | Status: DC | PRN

## 2023-07-11 NOTE — Progress Notes (Signed)
 Acute Office Visit  Subjective:     Patient ID: Zachary Armstrong, male    DOB: December 25, 1975, 48 y.o.   MRN: [id number]  Chief Complaint  Patient presents with   Fever    Temperature of 101-102 degrees x4 days   Cough    Productive with yellow-brown-green sputum x1 day, tried Tylenol and Mucinex    Pt is here for an acute visit. He is experiencing fever and cough, also increasing mucus production and nasal drip. Worsening symptoms, fever was 102, lost taste and smell. States that he didn't think that he was around anyone who was sick, but he has been out playing basketball and went to the bowling alley. No other family members ill. Positive body aches and pains,a little muscle soreness. Fatigue, maybe starting to feel better today. Did receive     Review of Systems  All other systems reviewed and are negative.       Objective:    BP 122/60   Pulse 80   Temp 99 F (37.2 C) (Oral)   Ht 5' 11 (1.803 m)   Wt 204 lb 11.2 oz (92.9 kg)   SpO2 98%   BMI 28.55 kg/m    Physical Exam Vitals reviewed.  Constitutional:      Appearance: Normal appearance. He is well-groomed and normal weight.  HENT:     Right Ear: Tympanic membrane normal.     Ears:     Comments: Could not visualize left TM due to cerumen     Nose: Rhinorrhea present.     Mouth/Throat:     Mouth: Mucous membranes are moist.     Pharynx: Posterior oropharyngeal erythema (mild no exudate) present.  Cardiovascular:     Rate and Rhythm: Normal rate and regular rhythm.     Heart sounds: S1 normal and S2 normal. No murmur heard. Pulmonary:     Effort: Pulmonary effort is normal.     Breath sounds: Normal breath sounds and air entry. No wheezing or rales.  Musculoskeletal:     Right lower leg: No edema.     Left lower leg: No edema.  Lymphadenopathy:     Cervical: No cervical adenopathy.  Neurological:     General: No focal deficit present.     Mental Status: He is alert and oriented to person, place, and time.      Gait: Gait is intact.  Psychiatric:        Mood and Affect: Affect normal.     Results for orders placed or performed in visit on 07/11/23  POC COVID-19  Result Value Ref Range   SARS Coronavirus 2 Ag Negative Negative  POC Influenza A/B  Result Value Ref Range   Influenza A, POC Negative Negative   Influenza B, POC Negative Negative        Assessment & Plan:   Problem List Items Addressed This Visit   None Visit Diagnoses       Fever, unspecified fever cause    -  Primary   Relevant Medications   ibuprofen  (ADVIL ) 800 MG tablet   Other Relevant Orders   POC COVID-19 (Completed)   POC Influenza A/B (Completed)     Cough, unspecified type       Relevant Orders   POC COVID-19 (Completed)     Physical exam is relatively benign, COVID is negative, Flu also also negative. I advised that the patient continue to use OTC medication for symptom control. Patient is concerned that his fevers have been  persistent so will rx ibuprofen  800 mg every 8 PRN for fever.  Meds ordered this encounter  Medications   ibuprofen  (ADVIL ) 800 MG tablet    Sig: Take 1 tablet (800 mg total) by mouth every 8 (eight) hours as needed for fever.    Dispense:  60 tablet    Refill:  0    Return if symptoms worsen or fail to improve.  Heron CHRISTELLA Sharper, MD

## 2023-07-11 NOTE — Patient Instructions (Addendum)
 Dextromethorphan and gauifenesin -- Mucinex DM or Robitussin DM Coricidin HBP --

## 2023-08-15 ENCOUNTER — Encounter: Payer: Self-pay | Admitting: Internal Medicine

## 2023-08-15 DIAGNOSIS — N529 Male erectile dysfunction, unspecified: Secondary | ICD-10-CM

## 2023-08-15 MED ORDER — TADALAFIL 5 MG PO TABS: 5.0000 mg | ORAL_TABLET | Freq: Every day | ORAL | 11 refills | Status: AC

## 2023-09-19 ENCOUNTER — Encounter: Payer: Self-pay | Admitting: Urology

## 2023-09-19 ENCOUNTER — Ambulatory Visit (INDEPENDENT_AMBULATORY_CARE_PROVIDER_SITE_OTHER): Payer: Managed Care, Other (non HMO) | Admitting: Urology

## 2023-09-19 VITALS — BP 136/75 | HR 75 | Ht 71.0 in | Wt 210.0 lb

## 2023-09-19 DIAGNOSIS — N529 Male erectile dysfunction, unspecified: Secondary | ICD-10-CM

## 2023-09-19 MED ORDER — SILDENAFIL CITRATE 50 MG PO TABS: ORAL_TABLET | ORAL | 0 refills | Status: DC

## 2023-09-19 NOTE — Progress Notes (Signed)
 I, Zachary Armstrong, acting as a scribe for Riki Altes, MD., have documented all relevant documentation on the behalf of Riki Altes, MD, as directed by Riki Altes, MD while in the presence of Riki Altes, MD.  09/19/2023 10:17 AM   Zachary Armstrong 01/10/1976 [id number]  Referring provider: Karie Schwalbe, MD [address] [place],  [place] [address]  Chief Complaint  Patient presents with   Establish Care   Erectile Dysfunction    HPI: Zachary Armstrong is a 48 y.o. male referred for evaluation of erectile dysfunction.   States he was started on tadalafil 5 mg as needed for ED around 2019 which was effective and he did not always use the medication.  Recently has become separated and has had increased stress and began to have worsening ED. he had successful intercourse with a previous friend on the several occasions prior to the first of the year He had a viral illness in early January and since that time has had difficulty achieving and maintaining an erection. He states his erections are less firm when erect. He states the penis leans to the side but no curvature.  SHIM score was 17/35 indicating mild ED.  He did increase the tadalafil to 20 mg with no significant improvement.    Surgical History: Past Surgical History:  Procedure Laterality Date   URETHRAL DILATION     ~ age 58   VASECTOMY      Home Medications:  Allergies as of 09/19/2023       Reactions   Grass Pollen(k-o-r-t-swt Vern) Itching        Medication List        Accurate as of September 19, 2023 10:17 AM. If you have any questions, ask your nurse or doctor.          ibuprofen 800 MG tablet Commonly known as: ADVIL Take 1 tablet (800 mg total) by mouth every 8 (eight) hours as needed for fever.   sildenafil 50 MG tablet Commonly known as: Viagra 1 to 2 tabs 1 hour prior to intercourse Started by: Riki Altes   tadalafil 5 MG tablet Commonly known as: CIALIS Take 1  tablet (5 mg total) by mouth daily.        Allergies:  Allergies  Allergen Reactions   Grass Pollen(K-O-R-T-Swt Vern) Itching    Family History: Family History  Problem Relation Age of Onset   Hypertension Mother    Hypertension Father    Diabetes Maternal Aunt    Heart disease Paternal Grandmother    Prostate cancer Paternal Grandfather     Social History:  reports that he has never smoked. He has been exposed to tobacco smoke. He has never used smokeless tobacco. He reports current alcohol use. No history on file for drug use.   Physical Exam: BP 136/75   Pulse 75   Ht 5\' 11"  (1.803 m)   Wt 210 lb (95.3 kg)   BMI 29.29 kg/m   Constitutional:  Alert and oriented, No acute distress. HEENT: [place] AT Respiratory: Normal respiratory effort, no increased work of breathing. GU: Phallus circumcised without lesions. No corporal plaques appreciated. Testes decent bilaterally, without masses or tenderness. Small bilateral hydroceles.  Psychiatric: Normal mood and affect.  Assessment & Plan:    1. Erectile dysfunction No significant organic risk factors and he feels some of his symptoms may be psychogenic in nature due to recent stresses.  Check testosterone level this morning.  He was in a trial of sildenafil. He declined the 100 mg dose and requested a 50 mg dose. He may take 1-2 tabs 1 hour prior to intercourse.  I have reviewed the above documentation for accuracy and completeness, and I agree with the above.   Riki Altes, MD  [facility] [address] [place], [place] [address] [phone]

## 2023-09-20 LAB — TESTOSTERONE: Testosterone: 413 ng/dL (ref 264–916)

## 2023-10-02 ENCOUNTER — Ambulatory Visit: Payer: Managed Care, Other (non HMO) | Admitting: Urology

## 2023-10-15 ENCOUNTER — Encounter: Payer: Self-pay | Admitting: Internal Medicine

## 2023-11-07 MED ORDER — SILDENAFIL CITRATE 50 MG PO TABS: ORAL_TABLET | ORAL | 11 refills | Status: DC

## 2023-12-22 ENCOUNTER — Encounter: Payer: Managed Care, Other (non HMO) | Admitting: Internal Medicine

## 2023-12-22 NOTE — Telephone Encounter (Signed)
 Called  pt and schedule a appt for cpe / labs

## 2023-12-26 ENCOUNTER — Ambulatory Visit: Admitting: Internal Medicine

## 2023-12-30 ENCOUNTER — Encounter: Payer: Self-pay | Admitting: Internal Medicine

## 2023-12-30 ENCOUNTER — Ambulatory Visit (INDEPENDENT_AMBULATORY_CARE_PROVIDER_SITE_OTHER): Admitting: Internal Medicine

## 2023-12-30 VITALS — BP 118/80 | HR 85 | Temp 98.7°F | Ht 71.0 in | Wt 210.0 lb

## 2023-12-30 DIAGNOSIS — M7701 Medial epicondylitis, right elbow: Secondary | ICD-10-CM | POA: Insufficient documentation

## 2023-12-30 DIAGNOSIS — Z1211 Encounter for screening for malignant neoplasm of colon: Secondary | ICD-10-CM

## 2023-12-30 DIAGNOSIS — Z125 Encounter for screening for malignant neoplasm of prostate: Secondary | ICD-10-CM

## 2023-12-30 DIAGNOSIS — Z0000 Encounter for general adult medical examination without abnormal findings: Secondary | ICD-10-CM | POA: Diagnosis not present

## 2023-12-30 DIAGNOSIS — E7800 Pure hypercholesterolemia, unspecified: Secondary | ICD-10-CM | POA: Diagnosis not present

## 2023-12-30 NOTE — Assessment & Plan Note (Signed)
 Discussed ice, rest, tendon strap, topical diclofenac

## 2023-12-30 NOTE — Addendum Note (Signed)
 Addended by: ISADORA RAISIN on: 12/30/2023 08:51 AM   Modules accepted: Orders

## 2023-12-30 NOTE — Assessment & Plan Note (Signed)
Was better last time Will recheck

## 2023-12-30 NOTE — Addendum Note (Signed)
 Addended by: JIMMY ADE I on: 12/30/2023 08:43 AM   Modules accepted: Orders

## 2023-12-30 NOTE — Patient Instructions (Signed)
 Please try ice, rest and topical diclofenac on your elbow.

## 2023-12-30 NOTE — Assessment & Plan Note (Signed)
 Healthy Works out regularly Flu vaccine in fall Will do cologuard Requests PSA

## 2023-12-30 NOTE — Progress Notes (Signed)
 Subjective:    Patient ID: Zachary Armstrong, male    DOB: 11-11-1975, 48 y.o.   MRN: [id number]  HPI Here for physical  Having trouble with right elbow Hard to shake hands, take shirt off, limiting him when lifting weights Feels he injured it doing pull-ups in January Some better with ROM, rubbing it Pain area is medial epicondyle  Doing better with ED treatment  Current Outpatient Medications on File Prior to Visit  Medication Sig Dispense Refill   ibuprofen  (ADVIL ) 800 MG tablet Take 1 tablet (800 mg total) by mouth every 8 (eight) hours as needed for fever. 60 tablet 0   sildenafil  (VIAGRA ) 50 MG tablet 1 to 2 tabs 1 hour prior to intercourse 30 tablet 11   tadalafil  (CIALIS ) 5 MG tablet Take 1 tablet (5 mg total) by mouth daily. 30 tablet 11   No current facility-administered medications on file prior to visit.    Allergies  Allergen Reactions   Grass Pollen(K-O-R-T-Swt Vern) Itching    History reviewed. No pertinent past medical history.  Past Surgical History:  Procedure Laterality Date   URETHRAL DILATION     ~ age 76   VASECTOMY      Family History  Problem Relation Age of Onset   Hypertension Mother    Hypertension Father    Diabetes Maternal Aunt    Heart disease Paternal Grandmother    Prostate cancer Paternal Grandfather     Social History   Socioeconomic History   Marital status: Married    Spouse name: Not on file   Number of children: 4   Years of education: Not on file   Highest education level: Not on file  Occupational History   Occupation: Veterinary surgeon and marketing    Comment: [employer]--works at home  Tobacco Use   Smoking status: Never    Passive exposure: Past   Smokeless tobacco: Never  Substance and Sexual Activity   Alcohol use: Yes    Comment: some wine socially   Drug use: Not on file   Sexual activity: Not on file  Other Topics Concern   Not on file  Social History Narrative   3 sons and 1  daughter   Social Drivers of Corporate investment banker Strain: Not on file  Food Insecurity: Not on file  Transportation Needs: Not on file  Physical Activity: Not on file  Stress: Not on file  Social Connections: Not on file  Intimate Partner Violence: Not on file   Review of Systems  Constitutional:  Negative for fatigue and unexpected weight change.       Wears seat belt  HENT:  Negative for dental problem, hearing loss, tinnitus and trouble swallowing.        Nasal congestion/drying--when out in [place]  Keeps up with dentist  Eyes:  Negative for visual disturbance.       Now with glasses  No diplopia or unilateral vision loss  Respiratory:  Negative for cough, chest tightness and shortness of breath.   Cardiovascular:  Negative for chest pain, palpitations and leg swelling.  Gastrointestinal:  Negative for blood in stool and constipation.       Rare heartburn  Endocrine: Negative for polydipsia and polyuria.  Genitourinary:  Negative for urgency.       Mild slowing of stream---especially after holding  Musculoskeletal:  Negative for joint swelling.       Some back pain in bed--better when up (when travelling)  Skin:  Some eczema---using OTC cream   Allergic/Immunologic: Negative for environmental allergies and immunocompromised state.  Neurological:  Negative for dizziness, syncope, light-headedness and headaches.  Hematological:  Negative for adenopathy. Does not bruise/bleed easily.  Psychiatric/Behavioral:  Negative for dysphoric mood and sleep disturbance. The patient is not nervous/anxious.        Objective:   Physical Exam Constitutional:      Appearance: Normal appearance.  HENT:     Mouth/Throat:     Pharynx: No oropharyngeal exudate or posterior oropharyngeal erythema.   Eyes:     Conjunctiva/sclera: Conjunctivae normal.     Pupils: Pupils are equal, round, and reactive to light.    Cardiovascular:     Rate and Rhythm: Normal rate and  regular rhythm.     Pulses: Normal pulses.     Heart sounds: No murmur heard.    No gallop.  Pulmonary:     Effort: Pulmonary effort is normal.     Breath sounds: Normal breath sounds. No wheezing or rales.  Abdominal:     Palpations: Abdomen is soft.     Tenderness: There is no abdominal tenderness.   Musculoskeletal:     Cervical back: Neck supple.     Right lower leg: No edema.     Left lower leg: No edema.     Comments: Mild tenderness in medial epicodyle on right  Lymphadenopathy:     Cervical: No cervical adenopathy.   Skin:    Findings: No lesion or rash.   Neurological:     General: No focal deficit present.     Mental Status: He is alert and oriented to person, place, and time.   Psychiatric:        Mood and Affect: Mood normal.        Behavior: Behavior normal.            Assessment & Plan:

## 2023-12-31 ENCOUNTER — Ambulatory Visit: Payer: Self-pay | Admitting: Internal Medicine

## 2023-12-31 LAB — COMPREHENSIVE METABOLIC PANEL WITH GFR
ALT: 22 IU/L (ref 0–44)
AST: 26 IU/L (ref 0–40)
Albumin: 4.1 g/dL (ref 4.1–5.1)
Alkaline Phosphatase: 79 IU/L (ref 44–121)
BUN/Creatinine Ratio: 8 — ABNORMAL LOW (ref 9–20)
BUN: 9 mg/dL (ref 6–24)
Bilirubin Total: 0.2 mg/dL (ref 0.0–1.2)
CO2: 22 mmol/L (ref 20–29)
Calcium: 9.2 mg/dL (ref 8.7–10.2)
Chloride: 105 mmol/L (ref 96–106)
Creatinine, Ser: 1.06 mg/dL (ref 0.76–1.27)
Globulin, Total: 2.9 g/dL (ref 1.5–4.5)
Glucose: 95 mg/dL (ref 70–99)
Potassium: 4 mmol/L (ref 3.5–5.2)
Sodium: 143 mmol/L (ref 134–144)
Total Protein: 7 g/dL (ref 6.0–8.5)
eGFR: 87 mL/min/{1.73_m2} (ref >59)

## 2023-12-31 LAB — LIPID PANEL
Chol/HDL Ratio: 3.6 ratio (ref 0.0–5.0)
Cholesterol, Total: 210 mg/dL — ABNORMAL HIGH (ref 100–199)
HDL: 59 mg/dL (ref >39)
LDL Chol Calc (NIH): 130 mg/dL — ABNORMAL HIGH (ref 0–99)
Triglycerides: 118 mg/dL (ref 0–149)
VLDL Cholesterol Cal: 21 mg/dL (ref 5–40)

## 2023-12-31 LAB — CBC
Hematocrit: 42.6 % (ref 37.5–51.0)
Hemoglobin: 14.1 g/dL (ref 13.0–17.7)
MCH: 30.6 pg (ref 26.6–33.0)
MCHC: 33.1 g/dL (ref 31.5–35.7)
MCV: 92 fL (ref 79–97)
Platelets: 264 10*3/uL (ref 150–450)
RBC: 4.61 x10E6/uL (ref 4.14–5.80)
RDW: 13.4 % (ref 11.6–15.4)
WBC: 4.3 10*3/uL (ref 3.4–10.8)

## 2023-12-31 LAB — PSA: Prostate Specific Ag, Serum: 3.2 ng/mL (ref 0.0–4.0)

## 2024-01-23 LAB — COLOGUARD: COLOGUARD: NEGATIVE

## 2024-03-10 DIAGNOSIS — N529 Male erectile dysfunction, unspecified: Secondary | ICD-10-CM

## 2024-03-15 NOTE — Telephone Encounter (Signed)
 Noted (see other message by clicking the blue '[employer]' link) and will notify pt via MyChart.

## 2024-04-01 ENCOUNTER — Encounter

## 2024-05-28 ENCOUNTER — Encounter

## 2024-05-28 ENCOUNTER — Ambulatory Visit (INDEPENDENT_AMBULATORY_CARE_PROVIDER_SITE_OTHER)

## 2024-05-28 VITALS — BP 132/78 | HR 61 | Temp 98.0°F | Ht 71.0 in | Wt 213.0 lb

## 2024-05-28 DIAGNOSIS — E7800 Pure hypercholesterolemia, unspecified: Secondary | ICD-10-CM

## 2024-05-28 DIAGNOSIS — Z113 Encounter for screening for infections with a predominantly sexual mode of transmission: Secondary | ICD-10-CM

## 2024-05-28 DIAGNOSIS — Z136 Encounter for screening for cardiovascular disorders: Secondary | ICD-10-CM

## 2024-05-28 DIAGNOSIS — Z131 Encounter for screening for diabetes mellitus: Secondary | ICD-10-CM | POA: Diagnosis not present

## 2024-05-28 DIAGNOSIS — N529 Male erectile dysfunction, unspecified: Secondary | ICD-10-CM | POA: Diagnosis not present

## 2024-05-28 NOTE — Progress Notes (Signed)
 Subjective:   This visit was conducted in person. The patient gave informed consent to the use of Abridge AI technology to record the contents of the encounter as documented below.   Patient ID: Zachary Armstrong, male    DOB: May 27, 1976, 48 y.o.   MRN: [id number]   Discussed the use of AI scribe software for clinical note transcription with the patient, who gave verbal consent to proceed.  History of Present Illness Zachary Armstrong is a 48 year old male who presents for a follow-up on his cholesterol levels and concerns about testosterone  levels.  He has a history of high cholesterol, first noted in June 2025, with elevated total cholesterol and LDL levels. He has been engaging in regular physical activity with his sons to improve his health. He experienced a period of stress and poor dietary habits due to a recent divorce, which he believes may have affected his cholesterol levels. He is interested in assessing any changes in his cholesterol levels since June, although he has not been fasting recently.  He uses Cialis  as needed, preferring the 5 mg dose, which he finds more effective than the 20 mg dose. He also has Viagra  at home but does not use it. He has noticed a decrease in the effectiveness of Cialis . He also uses L-citrulline, a supplement that he feels enhances his workouts, and is cautious about taking it on the same day as Cialis .  He is concerned about his testosterone  levels, which were measured at 419 ng/dL. He feels this is low for his age, especially given his active lifestyle and frequent travel for work. He notes feeling less energetic and attributes some of this to work-related stress and travel demands.  He recently passed a kidney stone without experiencing any pain, although he has had kidney stones in the past, with the last occurrence being several years ago.  He has undergone a vasectomy and associates some of his sexual function concerns with this procedure, although he  also considers stress from his job as a contributing factor. 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  He drinks wine socially and does not smoke or vape. He has three sons and one daughter and recently went through a divorce. No current issues with right elbow, no allergies to medications, no recent use of Motrin  or Advil .     Review of Systems  All other systems reviewed and are negative.       Allergies  Allergen Reactions   Grass Pollen(K-O-R-T-Swt Vern) Itching    Current Outpatient Medications on File Prior to Visit  Medication Sig Dispense Refill   OVER THE COUNTER MEDICATION 3-5 mg daily.     tadalafil  (CIALIS ) 5 MG tablet Take 1 tablet (5 mg total) by mouth daily. 30 tablet 11   No current facility-administered medications on file prior to visit.      BP 132/78 (BP Location: Left Arm, Patient Position: Sitting, Cuff Size: Large)   Pulse 61   Temp 98 F (36.7 C) (Oral)   Ht 5' 11 (1.803 m)   Wt 213 lb (96.6 kg)   SpO2 97%   BMI 29.71 kg/m   Objective:      Physical Exam GENERAL: Alert, cooperative, well developed, no acute distress. HEAD: Normocephalic atraumatic. EYES: Extraocular movements intact BL, pupils round, equal and reactive to light BL, conjunctivae normal BL. EXTREMITIES: No cyanosis or edema. NEUROLOGICAL: Oriented to person, place and time, no gait abnormalities, moves all  extremities without gross motor or sensory deficit.         Assessment & Plan:   Assessment & Plan Erectile dysfunction Cialis  5 mg decreased efficacy. Discussed increasing to either 10 mg as a single dose before sexual activity versus 5 mg daily. He will let me know how this goes. Counseled about the risks of being on 2 different ED medications, counseled that rather than alternate between the 2, it is safer to commit to one and a more efficacious dose. Further discussed testosterone  level, per chart  review, was done a few months ago WNL at 413.  Reassured patient that this is well within normal range and no supplementation is needed, counseled about natural methods to boost testosterone  including increasing intake of grass fed beef as well as avoiding products containing pesticides.  - Increase Cialis  to 10 mg if 5 mg ineffective. - Discontinued Viagra   Hyperlipidemia Elevated cholesterol and LDL on recent lipid panel few months ago, was not started on medication. Recent lifestyle changes noted.  Patient would like to continue with this and repeat soon to see if improved.  Decision whether to start cholesterol medication will depend on ASCVD risk score.  - Repeat fasting lipid panel in one month.   General Health Maintenance See age-appropriate fasting labs ordered below  - Schedule fasting lab in one month for cholesterol, A1c, HIV, and hepatitis C.   Return in about 7 months (around 12/29/2024) for CPE, fasting lab visit 32mo from now.   Zachary Armstrong K Hurley Sobel, MD  05/28/24     Contains text generated by Abridge.

## 2024-05-28 NOTE — Patient Instructions (Signed)
 Thank you for visiting [facility] today! Here's what we talked about: - Consider grass fed beef and fruit and veg free of processing/pesticides - Use Cialis , up to 10mg  before sexual activity is fine versus 5mg  daily

## 2024-05-28 NOTE — Telephone Encounter (Signed)
 Pt moved to 12 pm today

## 2024-06-28 ENCOUNTER — Other Ambulatory Visit (INDEPENDENT_AMBULATORY_CARE_PROVIDER_SITE_OTHER)

## 2024-06-28 DIAGNOSIS — Z136 Encounter for screening for cardiovascular disorders: Secondary | ICD-10-CM | POA: Diagnosis not present

## 2024-06-28 DIAGNOSIS — Z113 Encounter for screening for infections with a predominantly sexual mode of transmission: Secondary | ICD-10-CM

## 2024-06-28 DIAGNOSIS — Z131 Encounter for screening for diabetes mellitus: Secondary | ICD-10-CM | POA: Diagnosis not present

## 2024-06-28 LAB — LIPID PANEL
Cholesterol: 206 mg/dL — ABNORMAL HIGH (ref 28–200)
HDL: 53.3 mg/dL
LDL Cholesterol: 134 mg/dL — ABNORMAL HIGH (ref 10–99)
NonHDL: 152.75
Total CHOL/HDL Ratio: 4
Triglycerides: 92 mg/dL (ref 10.0–149.0)
VLDL: 18.4 mg/dL (ref 0.0–40.0)

## 2024-06-28 LAB — HEMOGLOBIN A1C: Hgb A1c MFr Bld: 5.9 % (ref 4.6–6.5)

## 2024-06-29 ENCOUNTER — Other Ambulatory Visit

## 2024-06-29 LAB — HIV ANTIBODY (ROUTINE TESTING W REFLEX)
HIV 1&2 Ab, 4th Generation: NONREACTIVE
HIV FINAL INTERPRETATION: NEGATIVE

## 2024-06-29 LAB — HEPATITIS C ANTIBODY: Hepatitis C Ab: NONREACTIVE

## 2024-07-05 ENCOUNTER — Ambulatory Visit: Payer: Self-pay

## 2024-07-05 DIAGNOSIS — R7303 Prediabetes: Secondary | ICD-10-CM | POA: Insufficient documentation

## 2024-07-05 NOTE — Addendum Note (Signed)
 Addended by: BENNETT REUBEN POUR on: 07/05/2024 11:24 AM   Modules accepted: Orders

## 2024-07-22 DIAGNOSIS — Z125 Encounter for screening for malignant neoplasm of prostate: Secondary | ICD-10-CM | POA: Insufficient documentation

## 2024-07-22 NOTE — Assessment & Plan Note (Addendum)
" -   Cialis  5mg  PRN (2019)   - Cialis  20mg  PRN (2025) - no significant imprveoment  - SHIM 17 (2025)  - Sildenafil  50-100mg  PRN (Mar 2025)  We reviewed the basic tenets of erectile dysfunction management, including normal erectile physiology, common contributing factors, and the spectrum of therapeutic options. Conservative measures such as lifestyle modification, optimization of comorbid conditions, and avoidance of exacerbating medications were discussed. Pharmacologic options including PDE5 inhibitors, intracavernosal or intraurethral therapies, and vacuum erection devices were reviewed, as well as surgical approaches such as penile prosthesis implantation. All questions were answered.   I think there is a high likelihood of psychologic component to his ED.  Contributions from high stress, divorce, relationship issues with partner undergoing menopause.  Today's shim was 20 without any medical therapy.  He inquired about continuation of off-label supplements versus restarting Cialis .  I am unable to comment on the various non-FDA regulated supplements-although likely safe but also likely placebo.  I would be fine to restart 5 mg daily Cialis , which ultimately he preferred.   - restart 5mg  dialy Cialis  "

## 2024-07-22 NOTE — Progress Notes (Signed)
" ° °  07/28/2024 9:33 AM   Zachary Armstrong [birth year] [id number]  Reason for visit: Follow up ED   HPI: 49 y.o. male, initial follow up with me today, previously seen by Dr. Twylla in Mar 2025  First time meeting today We spent some time teasing out his history Seems like multifactorial fluctuations erectile function Recent divorce of 20 years, high stress job requirements Has new long-term partner going through menopause Has erectile function that is sometimes great without medication Also wonders if taking  hard honey through sex shop is safe - it is very effective for him  Family history of prostate cancer in grandfather only  Prior HPI: Hx of ED  - Cialis  5mg  PRN (2019)   - Cialis  20mg  PRN (2025) - no significant imprveoment  - SHIM 17 (2025)  - Sildenafil  50-100mg  PRN (Mar 2025)    Physical Exam: BP (!) 145/81   Pulse 72   Ht 5' 11 (1.803 m)   Wt 213 lb (96.6 kg)   BMI 29.71 kg/m    Constitutional:  Alert and oriented, No acute distress.  Laboratory Data:    Component Ref Range & Units (hover) 10 mo ago  Testosterone  413   Component Ref Range & Units (hover) 6 mo ago 1 yr ago 2 yr ago  Prostate Specific Ag, Serum 3.2 2.4 CM 2.1 CM  Comment: Roche ECLIA methodology.    Pertinent Imaging: N/A    Assessment & Plan:    Other male erectile dysfunction Assessment & Plan:  - Cialis  5mg  PRN (2019)   - Cialis  20mg  PRN (2025) - no significant imprveoment  - SHIM 17 (2025)  - Sildenafil  50-100mg  PRN (Mar 2025)  We reviewed the basic tenets of erectile dysfunction management, including normal erectile physiology, common contributing factors, and the spectrum of therapeutic options. Conservative measures such as lifestyle modification, optimization of comorbid conditions, and avoidance of exacerbating medications were discussed. Pharmacologic options including PDE5 inhibitors, intracavernosal or intraurethral therapies, and vacuum erection devices were  reviewed, as well as surgical approaches such as penile prosthesis implantation. All questions were answered.   I think there is a high likelihood of psychologic component to his ED.  Contributions from high stress, divorce, relationship issues with partner undergoing menopause.  Today's shim was 21 without any medical therapy.  He inquired about continuation of off-label supplements versus restarting Cialis .  I am unable to comment on the various non-FDA regulated supplements-although likely safe but also likely placebo.  I would be fine to restart 5 mg daily Cialis , which ultimately he preferred.   - restart 5mg  dialy Cialis   Orders: -     Tadalafil ; Take 1 tablet (5 mg total) by mouth daily as needed for erectile dysfunction.  Dispense: 30 tablet; Refill: 11  Encounter for screening prostate specific antigen (PSA) measurement Assessment & Plan: PSA 3.2 (June 2025)   - ~2 baseline Family history of prostate cancer grandfather  PSA slightly high for ~15-31 year old cohort, w/ elevation off baseline.   - Repeat PSA today.  If persistent elevation off age adjusted thresholds and historic baseline-Will need to discuss DRE prostate MRI  Orders: -     PSA; Future       Penne JONELLE Skye, MD  [facility] [address] [place], [place] [address] [phone] "

## 2024-07-22 NOTE — Assessment & Plan Note (Addendum)
 PSA 3.2 (June 2025)   - ~2 baseline Family history of prostate cancer grandfather  PSA slightly high for ~34-48 year old cohort, w/ elevation off baseline.   - Repeat PSA today.  If persistent elevation off age adjusted thresholds and historic baseline-Will need to discuss DRE prostate MRI

## 2024-07-28 ENCOUNTER — Ambulatory Visit (INDEPENDENT_AMBULATORY_CARE_PROVIDER_SITE_OTHER): Admitting: Urology

## 2024-07-28 VITALS — BP 159/77 | HR 74 | Ht 71.0 in | Wt 213.0 lb

## 2024-07-28 DIAGNOSIS — N528 Other male erectile dysfunction: Secondary | ICD-10-CM | POA: Diagnosis not present

## 2024-07-28 DIAGNOSIS — Z125 Encounter for screening for malignant neoplasm of prostate: Secondary | ICD-10-CM

## 2024-07-28 MED ORDER — TADALAFIL 5 MG PO TABS: 5.0000 mg | ORAL_TABLET | Freq: Every day | ORAL | 11 refills | Status: AC | PRN

## 2024-07-29 ENCOUNTER — Ambulatory Visit: Payer: Self-pay

## 2024-07-29 LAB — PSA: Prostate Specific Ag, Serum: 3.1 ng/mL (ref 0.0–4.0)

## 2024-12-30 ENCOUNTER — Encounter

## 2025-01-26 ENCOUNTER — Ambulatory Visit: Admitting: Urology
